# Patient Record
Sex: Female | Born: 1985 | Race: Black or African American | Hispanic: No | Marital: Married | Smoking: Never smoker
Health system: Southern US, Community
[De-identification: ages and names within clinical notes are randomized; demographics above are authoritative.]

## PROBLEM LIST (undated history)

## (undated) DIAGNOSIS — Z789 Other specified health status: Secondary | ICD-10-CM

## (undated) DIAGNOSIS — IMO0001 Reserved for inherently not codable concepts without codable children: Secondary | ICD-10-CM

## (undated) HISTORY — PX: NO PAST SURGERIES: SHX2092

---

## 2011-03-31 ENCOUNTER — Inpatient Hospital Stay (HOSPITAL_COMMUNITY): Admission: AD | Admit: 2011-03-31 | Payer: Self-pay | Source: Ambulatory Visit | Admitting: Obstetrics and Gynecology

## 2011-11-22 LAB — OB RESULTS CONSOLE RUBELLA ANTIBODY, IGM: Rubella: IMMUNE

## 2011-11-22 LAB — OB RESULTS CONSOLE HEPATITIS B SURFACE ANTIGEN: Hepatitis B Surface Ag: NEGATIVE

## 2011-11-22 LAB — OB RESULTS CONSOLE RPR: RPR: NONREACTIVE

## 2011-11-22 LAB — OB RESULTS CONSOLE ABO/RH: RH Type: POSITIVE

## 2011-11-30 LAB — OB RESULTS CONSOLE GC/CHLAMYDIA: Chlamydia: NEGATIVE

## 2012-03-06 NOTE — L&D Delivery Note (Signed)
Delivery Note  First Stage: Labor onset: 0800 Augmentation : none Analgesia /Anesthesia intrapartum: none SROM at 0930  Second Stage: Complete dilation at 1310 Onset of pushing at 1320 FHR second stage 90 -110 baseline range - continuous EFM placed with second stage  Delivery of a viable female at 1332 by CNM in LOA position Loose single wrap nuchal cord - reduced over head at delivery Cord double clamped after cessation of pulsation, cut by FOB  Baby to Dad for bonding  Mother assisted out of tub and to bed for third stage care  Cord blood sample collected   Third Stage: Placenta delivered [facility] intact with 3 VC @ 1343 Placenta disposition: for disposal Uterine tone firm / bleeding small  1st perineal laceration identified  - bilateral sulcus lacerations - right 1/2 cm and left 2cm lengths Anesthesia for repair: 1% xylocaine local Repair 3-0 vicryl for vaginal repair / 4-0 subcuticular perineal skin repair Est. Blood Loss (mL): 350  Complications: none  Mom to postpartum.  Baby to  Mom for bonding.  Newborn: Birth Weight: 6 pounds 15 ounces Apgar Scores: 9-9 Feeding planned: breast / circumcision planned  Marlinda Mike CNM, MSN 06/22/2012, 3:00 PM

## 2012-06-22 ENCOUNTER — Inpatient Hospital Stay (HOSPITAL_COMMUNITY)
Admission: AD | Admit: 2012-06-22 | Discharge: 2012-06-24 | DRG: 373 | Disposition: A | Discharge status: Home / Self Care | Payer: BC Managed Care – PPO | Source: Ambulatory Visit | Attending: Obstetrics and Gynecology | Admitting: Obstetrics and Gynecology

## 2012-06-22 ENCOUNTER — Encounter (HOSPITAL_COMMUNITY): Payer: Self-pay | Admitting: *Deleted

## 2012-06-22 DIAGNOSIS — O9903 Anemia complicating the puerperium: Secondary | ICD-10-CM | POA: Diagnosis not present

## 2012-06-22 DIAGNOSIS — D649 Anemia, unspecified: Secondary | ICD-10-CM | POA: Diagnosis not present

## 2012-06-22 HISTORY — DX: Other specified health status: Z78.9

## 2012-06-22 LAB — CBC
HCT: 34.6 % — ABNORMAL LOW (ref 36.0–46.0)
Hemoglobin: 11.7 g/dL — ABNORMAL LOW (ref 12.0–15.0)
MCH: 28 pg (ref 26.0–34.0)
MCHC: 33.8 g/dL (ref 30.0–36.0)
MCV: 82.8 fL (ref 78.0–100.0)
Platelets: 169 10*3/uL (ref 150–400)
RBC: 4.18 MIL/uL (ref 3.87–5.11)
RDW: 13.8 % (ref 11.5–15.5)
WBC: 13.3 10*3/uL — ABNORMAL HIGH (ref 4.0–10.5)

## 2012-06-22 LAB — RPR: RPR Ser Ql: NONREACTIVE

## 2012-06-22 MED ORDER — WITCH HAZEL-GLYCERIN EX PADS: 1.0000 "application " | MEDICATED_PAD | CUTANEOUS | Status: DC | PRN

## 2012-06-22 MED ORDER — IBUPROFEN 600 MG PO TABS: 600.0000 mg | ORAL_TABLET | Freq: Four times a day (QID) | ORAL | Status: DC | PRN

## 2012-06-22 MED ORDER — ACETAMINOPHEN 325 MG PO TABS: 650.0000 mg | ORAL_TABLET | ORAL | Status: DC | PRN

## 2012-06-22 MED ORDER — OXYCODONE-ACETAMINOPHEN 5-325 MG PO TABS
1.0000 | ORAL_TABLET | ORAL | Status: DC | PRN
Start: 2012-06-22 — End: 2012-06-24
  Filled 2012-06-22: qty 2

## 2012-06-22 MED ORDER — OXYTOCIN 10 UNIT/ML IJ SOLN
10.0000 [IU] | Freq: Once | INTRAMUSCULAR | Status: DC
  Filled 2012-06-22: qty 2

## 2012-06-22 MED ORDER — BENZOCAINE-MENTHOL 20-0.5 % EX AERO
1.0000 "application " | INHALATION_SPRAY | CUTANEOUS | Status: DC | PRN
  Administered 2012-06-22: 1 via TOPICAL
  Filled 2012-06-22: qty 56

## 2012-06-22 MED ORDER — SENNOSIDES-DOCUSATE SODIUM 8.6-50 MG PO TABS
2.0000 | ORAL_TABLET | Freq: Every day | ORAL | Status: DC
  Administered 2012-06-22 – 2012-06-23 (×2): 2 via ORAL

## 2012-06-22 MED ORDER — ONDANSETRON 4 MG PO TBDP
4.0000 mg | ORAL_TABLET | Freq: Once | ORAL | Status: AC
  Administered 2012-06-22: 4 mg via ORAL
  Filled 2012-06-22: qty 1

## 2012-06-22 MED ORDER — PRENATAL MULTIVITAMIN CH
1.0000 | ORAL_TABLET | Freq: Every day | ORAL | Status: DC
  Administered 2012-06-23: 1 via ORAL

## 2012-06-22 MED ORDER — CITRIC ACID-SODIUM CITRATE 334-500 MG/5ML PO SOLN: 30.0000 mL | ORAL | Status: DC | PRN

## 2012-06-22 MED ORDER — LANOLIN HYDROUS EX OINT: TOPICAL_OINTMENT | CUTANEOUS | Status: DC | PRN

## 2012-06-22 MED ORDER — DIPHENHYDRAMINE HCL 25 MG PO CAPS: 25.0000 mg | ORAL_CAPSULE | Freq: Four times a day (QID) | ORAL | Status: DC | PRN

## 2012-06-22 MED ORDER — SIMETHICONE 80 MG PO CHEW: 80.0000 mg | CHEWABLE_TABLET | ORAL | Status: DC | PRN

## 2012-06-22 MED ORDER — DIBUCAINE 1 % RE OINT: 1.0000 "application " | TOPICAL_OINTMENT | RECTAL | Status: DC | PRN

## 2012-06-22 MED ORDER — IBUPROFEN 600 MG PO TABS
600.0000 mg | ORAL_TABLET | Freq: Four times a day (QID) | ORAL | Status: DC
  Administered 2012-06-22 – 2012-06-24 (×8): 600 mg via ORAL
  Filled 2012-06-22 (×9): qty 1

## 2012-06-22 MED ORDER — LIDOCAINE HCL (PF) 1 % IJ SOLN
30.0000 mL | INTRAMUSCULAR | Status: DC | PRN
  Filled 2012-06-22 (×2): qty 30

## 2012-06-22 NOTE — Progress Notes (Signed)
S:  Ctx intense - some pressure with every ctx       Labored in tub 65 minutes - assisted out of tub - water temperature 96.7  O:  VS: Blood pressure 105/28, pulse 92, temperature 97.8 F (36.6 C), temperature source Oral, resp. rate 20, height 5\' 3"  (1.6 m), weight 83.462 kg (184 lb), unknown if currently breastfeeding.        FHR : 110 - moderate variability + accels with intermittent EFM tracing while out of tub        Toco: contractions every 3 minutes         Cervix : 8 / 90 / vtx / +2        Membranes: clear fluid / + show  A: active labor - transitioning     FHR reassuring - intermittent EFM  P: attempting to warm tub water for patient to re-enter tub       Inadequate water temperature in hospital - maintenance working to increase temperature        Continuous labor support with family - doula - midwife   Marlinda Mike CNM, MSN 06/22/2012, 1245pm

## 2012-06-22 NOTE — Progress Notes (Signed)
S:  calm - relaxing between ctx in tub       laboring in tub past 50 minutes - out to void x 1       + constant nausea - vomiting x 4  O:  VS: Blood pressure 119/72, pulse 89, temperature 97.6 F (36.4 C), temperature source Oral, resp. rate 18, height 5\' 3"  (1.6 m), weight 83.462 kg (184 lb).        FHR : doppler FHR        Toco: contractions every 3 minutes / moderate         Cervix : deferred at this time - recheck when out of tub next void        Membranes: clear fluid leakage        water temp 97 at 1145  A: active labor       P: water immersion with intermittent FHR     zofran ODT for nausea now     out of tub in next 1/2 hour - empty and & replace water ( hazy water - temp at 97)   Marlinda Mike CNM, MSN 06/22/2012, 12:03 PM

## 2012-06-22 NOTE — MAU Note (Signed)
Pt present with complaints of contractions that started being consistent around 8 am. States leakage of fluid that started about ago with clear fluid.

## 2012-06-22 NOTE — H&P (Signed)
  OB ADMISSION/ HISTORY & PHYSICAL:  Admission Date: 06/22/2012  9:43 AM  Admit Diagnosis: onset active labor  Tina Bass is a 27 y.o. female presenting for onset of labor - 0800 / SROM at 0930.  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  Prenatal Labs: ABO, Rh: A (09/18 0000) Positive Antibody: Negative (09/18 0000) Rubella: Immune (09/18 0000)  RPR: Nonreactive (09/18 0000)  HBsAg: Negative (09/18 0000)  HIV: Non-reactive (09/18 0000)  GBS: Negative (03/19 0000)  1 hr Glucola : NL  Medical / Surgical History :  Past medical history: History reviewed. No pertinent past medical history.   Past surgical history: History reviewed. No pertinent past surgical history.  Family History: History reviewed. No pertinent family history.   Social History:  reports that she has never smoked. She does not have any smokeless tobacco history on file. Her alcohol and drug histories are not on file.  Allergies: Review of patient's allergies indicates no known allergies.   Current Medications at time of admission:  Prenatal vitamin Valtrex 1000mg  suppression  Review of Systems: Ctx 0800 + bloody show - small amount SROM - clear fluid 1000  Physical Exam:  VS: Blood pressure 119/72, pulse 89, temperature 97.6 F (36.4 C), temperature source Oral, resp. rate 18.  General: alert and oriented, appears uncomfortable with ctx Heart: RRR Lungs: Clear lung fields Abdomen: Gravid, soft and non-tender, non-distended / uterus: nontender / gravid Extremities: trace edema  Genitalia / VE: Dilation: 4 Effacement (%): 90 Station: 0  FHR: baseline rate  / variability 115 / accels + / decels none TOCO: 2-3 minutes moderate  Assessment: [redacted] weeks gestation active stage of labor FHR category 1   Plan:  admit Dr Billy Coast -  (on-call) notified of admission / plan of care Dr Cherly Hensen - (primary) notifed of admit / pt request for her to come if changes her mind about water birth or needs CS   Marlinda Mike CNM, MSN 06/22/2012, 10:47 AM

## 2012-06-22 NOTE — Progress Notes (Signed)
Pt refused cbc at this time

## 2012-06-23 LAB — CBC
HCT: 27.7 % — ABNORMAL LOW (ref 36.0–46.0)
Hemoglobin: 9.5 g/dL — ABNORMAL LOW (ref 12.0–15.0)
MCH: 28.2 pg (ref 26.0–34.0)
MCHC: 34.3 g/dL (ref 30.0–36.0)
MCV: 82.2 fL (ref 78.0–100.0)
Platelets: 150 10*3/uL (ref 150–400)
RBC: 3.37 MIL/uL — ABNORMAL LOW (ref 3.87–5.11)
RDW: 13.8 % (ref 11.5–15.5)
WBC: 15.3 10*3/uL — ABNORMAL HIGH (ref 4.0–10.5)

## 2012-06-23 NOTE — Progress Notes (Signed)
PPD 1 SVD / Linden Dolin  S:  Reports feeling well             Tolerating po/ No nausea or vomiting             Bleeding is light             Pain controlled with motrin             Up ad lib / ambulatory / voiding QS  Newborn breast feeding  / Circumcision planned today ( Dr Wonda Olds)   O:               VS: BP 95/57  Pulse 80  Temp(Src) 98.3 F (36.8 C) (Oral)  Resp 18  Ht 5\' 3"  (1.6 m)  Wt 83.462 kg (184 lb)  BMI 32.6 kg/m2  SpO2 100%   LABS:  Recent Labs  06/22/12 1303 06/23/12 0630  WBC 13.3* 15.3*  HGB 11.7* 9.5*  PLT 169 150                Physical Exam:             Alert and oriented X3  Lungs: Clear and unlabored  Heart: regular rate and rhythm / no mumurs  Abdomen: soft, non-tender, non-distended              Fundus: firm, non-tender, U-1  Perineum: mild edema / ice in place  Lochia: light  Extremities: no edema, no calf pain or tenderness   A: PPD # 1              Mild anemia  Doing well - stable status  P:  Routine post partum orders  Possible early DC this pm  Marlinda Mike CNM, MSN 06/23/2012, 9:45 AM

## 2012-06-24 MED ORDER — IBUPROFEN 600 MG PO TABS: 600.0000 mg | ORAL_TABLET | Freq: Four times a day (QID) | ORAL | Status: DC

## 2012-06-24 MED ORDER — DOCUSATE SODIUM 100 MG PO CAPS: 100.0000 mg | ORAL_CAPSULE | Freq: Two times a day (BID) | ORAL | Status: AC | PRN

## 2012-06-24 MED ORDER — TETANUS-DIPHTH-ACELL PERTUSSIS 5-2.5-18.5 LF-MCG/0.5 IM SUSP
0.5000 mL | Freq: Once | INTRAMUSCULAR | Status: AC
  Administered 2012-06-24: 0.5 mL via INTRAMUSCULAR
  Filled 2012-06-24: qty 0.5

## 2012-06-24 NOTE — Progress Notes (Signed)
Post discharge review completed. 

## 2012-06-24 NOTE — Discharge Summary (Signed)
Obstetric Discharge Summary Reason for Admission: G1 P0 @ 39w 3d with onset of active labor and SROM Prenatal Procedures: ultrasound Intrapartum Procedures: spontaneous vaginal delivery Postpartum Procedures: none Complications-Operative and Postpartum: 1st degree perineal laceration Hemoglobin  Date Value Range Status  06/23/2012 9.5* 12.0 - 15.0 g/dL Final     REPEATED TO VERIFY     DELTA CHECK NOTED     HCT  Date Value Range Status  06/23/2012 27.7* 36.0 - 46.0 % Final    Physical Exam:  General: alert, cooperative and no distress Lochia: appropriate Uterine Fundus: firm Incision: n/a DVT Evaluation: No evidence of DVT seen on physical exam.  Discharge Diagnoses: G1 P1 s/p SVD with 1st deg lac/ Successful water birth  Discharge Information: Date: 06/24/2012 Activity: pelvic rest Diet: routine Medications: PNV, Ibuprofen and Colace Condition: stable Instructions: refer to practice specific booklet Discharge to: home Follow-up Information   Follow up with COUSINS,SHERONETTE A, MD In 6 weeks.   Contact information:   [address] [place] [address] [phone]       Newborn Data: Live born female on 06/22/12 Montez Morita) Birth Weight: 6 lb 15.3 oz (3155 g) APGAR: 9, 9  Home with mother.  Dona Klemann K 06/24/2012, 10:20 AM

## 2012-06-24 NOTE — Progress Notes (Signed)
Patient ID: Tina Bass, female   DOB: Apr 15, 1985, 27 y.o.   MRN: [id number] PPD # 2  Subjective: Pt reports feeling well and eager for d/c home/ Pain controlled with ibuprofen Tolerating po/ Voiding without problems/ No n/v Bleeding is light/ Newborn info:  Information for the patient's newborn:  Syerra, Abdelrahman [[id number]]  female  / circ completed per Dr Cherly Hensen yesterday/ Feeding: breast    Objective:  VS: Blood pressure 99/67, pulse 64, temperature 97.9 F (36.6 C), temperature source Oral, resp. rate 18.    Recent Labs  06/22/12 1303 06/23/12 0630  WBC 13.3* 15.3*  HGB 11.7* 9.5*  HCT 34.6* 27.7*  PLT 169 150    Blood type: A/Positive/-- (09/18 0000) Rubella: Immune (09/18 0000)    Physical Exam:  General: A & O x 3  alert, cooperative and no distress CV: Regular rate and rhythm Resp: clear Abdomen: soft, nontender, normal bowel sounds Uterine Fundus: firm, below umbilicus, nontender Perineum: healing with good reapproximation Lochia: minimal Ext: Homans sign is negative, no sign of DVT and no edema, redness or tenderness in the calves or thighs    A/P: PPD # 2/ G1P1001/ S/P: SVD with 1st deg lac Doing well and stable for discharge home RX: Ibuprofen 600mg  po Q 6 hrs prn pain #30 Refill x 1 Colace 100mg  po BID prn #60 Ref x 1 WOB/GYN booklet given Routine pp visit in 6wks   Demetrius Revel, MSN, [facility] 06/24/2012, 9:54 AM

## 2014-01-05 ENCOUNTER — Encounter (HOSPITAL_COMMUNITY): Payer: Self-pay | Admitting: *Deleted

## 2014-02-18 LAB — OB RESULTS CONSOLE RPR: RPR: NONREACTIVE

## 2014-02-18 LAB — OB RESULTS CONSOLE ANTIBODY SCREEN: Antibody Screen: NEGATIVE

## 2014-02-18 LAB — OB RESULTS CONSOLE ABO/RH: RH Type: POSITIVE

## 2014-02-18 LAB — OB RESULTS CONSOLE HIV ANTIBODY (ROUTINE TESTING): HIV: NONREACTIVE

## 2014-02-18 LAB — OB RESULTS CONSOLE HEPATITIS B SURFACE ANTIGEN: Hepatitis B Surface Ag: NEGATIVE

## 2014-02-18 LAB — OB RESULTS CONSOLE RUBELLA ANTIBODY, IGM: Rubella: IMMUNE

## 2014-03-02 LAB — OB RESULTS CONSOLE GC/CHLAMYDIA
Chlamydia: NEGATIVE
Gonorrhea: NEGATIVE

## 2014-03-06 NOTE — L&D Delivery Note (Signed)
Delivery Note  First Stage: Labor onset: 0100 Augmentation : none Analgesia /Anesthesia intrapartum: water immersion SROM at 0430 clear fluid  Second Stage: Complete dilation at 0438 Onset of pushing at 0438 FHR second stage 130 by doppler / rise in FHR heard with contraction  Delivery of a viable female at 46 by CNM in LOA position Tight nuchal cord x 1  Cord double clamped after cessation of pulsation, cut by FOB Cord blood sample collected    Third Stage: Placenta delivered via Tomasa Blase intact with 3 VC @ 0504 Trailing membranes removed manually using Fundal massage and firm grip with Ring Forceps / intact / no retained pieces noted with bimanual sweep of uterine cavity Placenta disposition: L&D for 24 hr storage, then patient taking it home for encapsulation Uterine tone firm / bleeding small after repair  2nd perineal laceration identified  Anesthesia for repair: 1% lidocaine Repair 2.0 vicryl Est. Blood Loss (mL): 375 (~200 in pool immediately after delivery)  Complications: none  Mom to postpartum.  Baby to Couplet care / Skin to Skin.  Newborn: Birth Weight: 6 lbs 10.7 oz  Apgar Scores: 8/9 Feeding planned: breast  Raelyn Mora, M  MSN, CNM 10/01/2014, 6:26 AM

## 2014-03-29 ENCOUNTER — Encounter (HOSPITAL_COMMUNITY): Payer: Self-pay | Admitting: *Deleted

## 2014-03-29 ENCOUNTER — Emergency Department (HOSPITAL_COMMUNITY)
Admission: EM | Admit: 2014-03-29 | Discharge: 2014-03-29 | Disposition: A | Discharge status: Home / Self Care | Payer: Federal, State, Local not specified - PPO | Attending: Emergency Medicine | Admitting: Emergency Medicine

## 2014-03-29 DIAGNOSIS — R112 Nausea with vomiting, unspecified: Secondary | ICD-10-CM | POA: Insufficient documentation

## 2014-03-29 LAB — I-STAT BETA HCG BLOOD, ED (MC, WL, AP ONLY)

## 2014-03-29 LAB — COMPREHENSIVE METABOLIC PANEL
ALBUMIN: 3.8 g/dL (ref 3.5–5.2)
ALK PHOS: 32 U/L — AB (ref 39–117)
ALT: 23 U/L (ref 0–35)
AST: 27 U/L (ref 0–37)
Anion gap: 12 (ref 5–15)
BUN: 14 mg/dL (ref 6–23)
CALCIUM: 9.2 mg/dL (ref 8.4–10.5)
CHLORIDE: 101 mmol/L (ref 96–112)
CO2: 22 mmol/L (ref 19–32)
CREATININE: 0.76 mg/dL (ref 0.50–1.10)
GFR calc non Af Amer: >90 mL/min (ref >90)
Glucose, Bld: 91 mg/dL (ref 70–99)
Potassium: 3.7 mmol/L (ref 3.5–5.1)
Sodium: 135 mmol/L (ref 135–145)
Total Bilirubin: 0.7 mg/dL (ref 0.3–1.2)
Total Protein: 7.7 g/dL (ref 6.0–8.3)

## 2014-03-29 LAB — CBC WITH DIFFERENTIAL/PLATELET
BASOS ABS: 0 10*3/uL (ref 0.0–0.1)
Basophils Relative: 0 % (ref 0–1)
Eosinophils Absolute: 0 10*3/uL (ref 0.0–0.7)
Eosinophils Relative: 0 % (ref 0–5)
HEMATOCRIT: 44.1 % (ref 36.0–46.0)
Hemoglobin: 14.8 g/dL (ref 12.0–15.0)
LYMPHS ABS: 0.5 10*3/uL — AB (ref 0.7–4.0)
Lymphocytes Relative: 7 % — ABNORMAL LOW (ref 12–46)
MCH: 27.3 pg (ref 26.0–34.0)
MCHC: 33.6 g/dL (ref 30.0–36.0)
MCV: 81.4 fL (ref 78.0–100.0)
MONO ABS: 0.2 10*3/uL (ref 0.1–1.0)
Monocytes Relative: 3 % (ref 3–12)
NEUTROS ABS: 5.9 10*3/uL (ref 1.7–7.7)
NEUTROS PCT: 90 % — AB (ref 43–77)
Platelets: 192 10*3/uL (ref 150–400)
RBC: 5.42 MIL/uL — ABNORMAL HIGH (ref 3.87–5.11)
RDW: 13.7 % (ref 11.5–15.5)
WBC: 6.6 10*3/uL (ref 4.0–10.5)

## 2014-03-29 MED ORDER — SODIUM CHLORIDE 0.9 % IV BOLUS (SEPSIS)
1000.0000 mL | Freq: Once | INTRAVENOUS | Status: AC
  Administered 2014-03-29: 1000 mL via INTRAVENOUS

## 2014-03-29 NOTE — ED Notes (Addendum)
Pt reports n/v this am but denies any diarrhea. Pt is approx [redacted] weeks pregnant.

## 2014-03-29 NOTE — ED Notes (Signed)
Attempted to draw pts labs was unsuccessful  

## 2014-03-29 NOTE — ED Provider Notes (Signed)
CSN: [id number]     Arrival date & time 03/29/14  1500 History   First MD Initiated Contact with Patient 03/29/14 1711     Chief Complaint  Patient presents with  . Emesis     (Consider location/radiation/quality/duration/timing/severity/associated sxs/prior Treatment) Patient is a 29 y.o. female presenting with vomiting.  Emesis Severity:  Moderate Duration:  1 day Timing:  Constant Quality:  Stomach contents Progression:  Unchanged Chronicity:  New Recent urination:  Normal Context: not post-tussive and not self-induced   Relieved by:  Nothing Worsened by:  Nothing tried Ineffective treatments:  None tried Associated symptoms: no abdominal pain, no chills, no cough, no diarrhea, no myalgias and no URI   Risk factors: pregnant now     Past Medical History  Diagnosis Date  . Medical history non-contributory    Past Surgical History  Procedure Laterality Date  . No past surgeries     History reviewed. No pertinent family history. History  Substance Use Topics  . Smoking status: Never Smoker   . Smokeless tobacco: Never Used  . Alcohol Use: No   OB History    Gravida Para Term Preterm AB TAB SAB Ectopic Multiple Living   Review of Systems  Constitutional: Negative for chills.  Gastrointestinal: Positive for vomiting. Negative for abdominal pain and diarrhea.  Musculoskeletal: Negative for myalgias.  All other systems reviewed and are negative.     Allergies  Review of patient's allergies indicates no known allergies.  Home Medications   Prior to Admission medications   Medication Sig Start Date End Date Taking? Authorizing Provider  Prenatal Vit-Fe Fumarate-FA (PRENATAL MULTIVITAMIN) TABS Take 1 tablet by mouth at bedtime.    Yes Historical Provider, MD  ibuprofen (ADVIL,MOTRIN) 600 MG tablet Take 1 tablet (600 mg total) by mouth every 6 (six) hours. Patient not taking: Reported on 03/29/2014 06/24/12   Demetrius Revel, NP   BP 107/54  mmHg  Pulse 88  Temp(Src) 98 F (36.7 C) (Oral)  Resp 18  SpO2 100% Physical Exam  Constitutional: She is oriented to person, place, and time. She appears well-developed and well-nourished.  HENT:  Head: Normocephalic and atraumatic.  Right Ear: External ear normal.  Left Ear: External ear normal.  Eyes: Conjunctivae and EOM are normal. Pupils are equal, round, and reactive to light.  Neck: Normal range of motion. Neck supple.  Cardiovascular: Normal rate, regular rhythm, normal heart sounds and intact distal pulses.   Pulmonary/Chest: Effort normal and breath sounds normal.  Abdominal: Soft. Bowel sounds are normal. There is no tenderness.  Musculoskeletal: Normal range of motion.  Neurological: She is alert and oriented to person, place, and time.  Skin: Skin is warm and dry.  Vitals reviewed.   ED Course  Procedures (including critical care time) Labs Review Labs Reviewed  CBC WITH DIFFERENTIAL/PLATELET - Abnormal; Notable for the following:    RBC 5.42 (*)    Neutrophils Relative % 90 (*)    Lymphocytes Relative 7 (*)    Lymphs Abs 0.5 (*)    All other components within normal limits  COMPREHENSIVE METABOLIC PANEL - Abnormal; Notable for the following:    Alkaline Phosphatase 32 (*)    All other components within normal limits  I-STAT BETA HCG BLOOD, ED ([facility], [facility], [facility] ONLY) - Abnormal; Notable for the following:    I-stat hCG, quantitative >2000.0 (*)    All other components within normal limits  Imaging Review No results found.   EKG Interpretation None      EMERGENCY DEPARTMENT US PREGNANCY "Study: Limited Ultrasound of the Pelvis for Pregnancy"  INDICATIONS:Pregnancy(required) Multiple views of the uterus and pelvic cavity were obtained in real-time with a multi-frequency probe.  APPROACH:Transabdominal   PERFORMED BY: Myself  IMAGES ARCHIVED?: Yes  LIMITATIONS: Emergent procedure  PREGNANCY FREE FLUID: None  ADNEXAL FINDINGS:None  PREGNANCY  FINDINGS: Fetal heart activity seen  INTERPRETATION: Viable intrauterine pregnancy  GESTATIONAL AGE, ESTIMATE: 13 wks 5 days  FETAL HEART RATE: present, limited by movement of fetus  Emergency Ultrasound Study:   Angiocath insertion Performed by: Mirian MoGentry, Matthew  Consent: Verbal consent obtained. Risks and benefits: risks, benefits and alternatives were discussed Immediately prior to procedure the correct patient, procedure, equipment, support staff and site/side marked as needed.  Indication: difficult IV access Preparation: Patient was prepped and draped in the usual sterile fashion. Vein Location: R brachial vein was visualized during assessment for potential access sites and was found to be patent/ easily compressed with linear ultrasound.  The needle was visualized with real-time ultrasound and guided into the vein. Gauge: 20  Image saved and stored.  Normal blood return.  Patient tolerance: Patient tolerated the procedure well with no immediate complications.         MDM   Final diagnoses:  Non-intractable vomiting with nausea, vomiting of unspecified type    29 y.o. female with pertinent PMH of G2P1, no prior problems with pregnancy presents with nausea and vomiting x 1 day.  Patient's son has had similar symptoms. On arrival patient has vital signs and physical exam as above. Ultrasound obtained of abdomen with normal IUP, also used for vascular access. Discussed risks of Zofran with patient.  She elected to forego this treatment.   Symptoms improved after NS bolus.  Pt requested to return home. Strict Return precautions given.  I have reviewed all laboratory and imaging studies if ordered as above  1. Non-intractable vomiting with nausea, vomiting of unspecified type         Mirian MoMatthew Gentry, MD 03/30/14 0045

## 2014-03-29 NOTE — Discharge Instructions (Signed)
Nausea and Vomiting Nausea is a sick feeling that often comes before throwing up (vomiting). Vomiting is a reflex where stomach contents come out of your mouth. Vomiting can cause severe loss of body fluids (dehydration). Children and elderly adults can become dehydrated quickly, especially if they also have diarrhea. Nausea and vomiting are symptoms of a condition or disease. It is important to find the cause of your symptoms. CAUSES   Direct irritation of the stomach lining. This irritation can result from increased acid production (gastroesophageal reflux disease), infection, food poisoning, taking certain medicines (such as nonsteroidal anti-inflammatory drugs), alcohol use, or tobacco use.  Signals from the brain.These signals could be caused by a headache, heat exposure, an inner ear disturbance, increased pressure in the brain from injury, infection, a tumor, or a concussion, pain, emotional stimulus, or metabolic problems.  An obstruction in the gastrointestinal tract (bowel obstruction).  Illnesses such as diabetes, hepatitis, gallbladder problems, appendicitis, kidney problems, cancer, sepsis, atypical symptoms of a heart attack, or eating disorders.  Medical treatments such as chemotherapy and radiation.  Receiving medicine that makes you sleep (general anesthetic) during surgery. DIAGNOSIS Your caregiver may ask for tests to be done if the problems do not improve after a few days. Tests may also be done if symptoms are severe or if the reason for the nausea and vomiting is not clear. Tests may include:  Urine tests.  Blood tests.  Stool tests.  Cultures (to look for evidence of infection).  X-rays or other imaging studies. Test results can help your caregiver make decisions about treatment or the need for additional tests. TREATMENT You need to stay well hydrated. Drink frequently but in small amounts.You may wish to drink water, sports drinks, clear broth, or eat frozen  ice pops or gelatin dessert to help stay hydrated.When you eat, eating slowly may help prevent nausea.There are also some antinausea medicines that may help prevent nausea. HOME CARE INSTRUCTIONS   Take all medicine as directed by your caregiver.  If you do not have an appetite, do not force yourself to eat. However, you must continue to drink fluids.  If you have an appetite, eat a normal diet unless your caregiver tells you differently.  Eat a variety of complex carbohydrates (rice, wheat, potatoes, bread), lean meats, yogurt, fruits, and vegetables.  Avoid high-fat foods because they are more difficult to digest.  Drink enough water and fluids to keep your urine clear or pale yellow.  If you are dehydrated, ask your caregiver for specific rehydration instructions. Signs of dehydration may include:  Severe thirst.  Dry lips and mouth.  Dizziness.  Dark urine.  Decreasing urine frequency and amount.  Confusion.  Rapid breathing or pulse. SEEK IMMEDIATE MEDICAL CARE IF:   You have blood or brown flecks (like coffee grounds) in your vomit.  You have black or bloody stools.  You have a severe headache or stiff neck.  You are confused.  You have severe abdominal pain.  You have chest pain or trouble breathing.  You do not urinate at least once every 8 hours.  You develop cold or clammy skin.  You continue to vomit for longer than 24 to 48 hours.  You have a fever. MAKE SURE YOU:   Understand these instructions.  Will watch your condition.  Will get help right away if you are not doing well or get worse. Document Released: 02/20/2005 Document Revised: 05/15/2011 Document Reviewed: 07/20/2010 Exit[employer] Patient Information 2015 [employer]. This information is not intended   to replace advice given to you by your health care provider. Make sure you discuss any questions you have with your health care provider. Hyperemesis Gravidarum Hyperemesis gravidarum is  a severe form of nausea and vomiting that happens during pregnancy. Hyperemesis is worse than morning sickness. It may cause you to have nausea or vomiting all day for many days. It may keep you from eating and drinking enough food and liquids. Hyperemesis usually occurs during the first half (the first 20 weeks) of pregnancy. It often goes away once a woman is in her second half of pregnancy. However, sometimes hyperemesis continues through an entire pregnancy.  CAUSES  The cause of this condition is not completely known but is thought to be related to changes in the body's hormones when pregnant. It could be from the high level of the pregnancy hormone or an increase in estrogen in the body.  SIGNS AND SYMPTOMS   Severe nausea and vomiting.  Nausea that does not go away.  Vomiting that does not allow you to keep any food down.  Weight loss and body fluid loss (dehydration).  Having no desire to eat or not liking food you have previously enjoyed. DIAGNOSIS  Your health care provider will do a physical exam and ask you about your symptoms. He or she may also order blood tests and urine tests to make sure something else is not causing the problem.  TREATMENT  You may only need medicine to control the problem. If medicines do not control the nausea and vomiting, you will be treated in the hospital to prevent dehydration, increased acid in the blood (acidosis), weight loss, and changes in the electrolytes in your body that may harm the unborn baby (fetus). You may need IV fluids.  HOME CARE INSTRUCTIONS   Only take over-the-counter or prescription medicines as directed by your health care provider.  Try eating a couple of dry crackers or toast in the morning before getting out of bed.  Avoid foods and smells that upset your stomach.  Avoid fatty and spicy foods.  Eat 5-6 small meals a day.  Do not drink when eating meals. Drink between meals.  For snacks, eat high-protein foods, such as  cheese.  Eat or suck on things that have ginger in them. Ginger helps nausea.  Avoid food preparation. The smell of food can spoil your appetite.  Avoid iron pills and iron in your multivitamins until after 3-4 months of being pregnant. However, consult with your health care provider before stopping any prescribed iron pills. SEEK MEDICAL CARE IF:   Your abdominal pain increases.  You have a severe headache.  You have vision problems.  You are losing weight. SEEK IMMEDIATE MEDICAL CARE IF:   You are unable to keep fluids down.  You vomit blood.  You have constant nausea and vomiting.  You have excessive weakness.  You have extreme thirst.  You have dizziness or fainting.  You have a fever or persistent symptoms for more than 2-3 days.  You have a fever and your symptoms suddenly get worse. MAKE SURE YOU:   Understand these instructions.  Will watch your condition.  Will get help right away if you are not doing well or get worse. Document Released: 02/20/2005 Document Revised: 12/11/2012 Document Reviewed: 10/02/2012 [facility] LP[facility] Patient Information 2015 [employer], [place]. This information is not intended to replace advice given to you by your health care provider. Make sure you discuss any questions you have with your health care provider.

## 2014-03-29 NOTE — ED Notes (Signed)
Md at bedside

## 2014-09-02 LAB — OB RESULTS CONSOLE GBS: GBS: NEGATIVE

## 2014-10-01 ENCOUNTER — Inpatient Hospital Stay (HOSPITAL_COMMUNITY)
Admission: AD | Admit: 2014-10-01 | Discharge: 2014-10-02 | DRG: 775 | Disposition: A | Discharge status: Home / Self Care | Payer: Federal, State, Local not specified - PPO | Source: Ambulatory Visit | Attending: Obstetrics and Gynecology | Admitting: Obstetrics and Gynecology

## 2014-10-01 ENCOUNTER — Encounter (HOSPITAL_COMMUNITY): Payer: Self-pay | Admitting: *Deleted

## 2014-10-01 DIAGNOSIS — Z3A39 39 weeks gestation of pregnancy: Secondary | ICD-10-CM | POA: Diagnosis present

## 2014-10-01 DIAGNOSIS — IMO0001 Reserved for inherently not codable concepts without codable children: Secondary | ICD-10-CM

## 2014-10-01 HISTORY — DX: Reserved for inherently not codable concepts without codable children: IMO0001

## 2014-10-01 LAB — OB RESULTS CONSOLE GBS: STREP GROUP B AG: NEGATIVE

## 2014-10-01 MED ORDER — SENNOSIDES-DOCUSATE SODIUM 8.6-50 MG PO TABS
2.0000 | ORAL_TABLET | ORAL | Status: DC
  Administered 2014-10-02: 2 via ORAL
  Filled 2014-10-01: qty 2

## 2014-10-01 MED ORDER — ZOLPIDEM TARTRATE 5 MG PO TABS: 5.0000 mg | ORAL_TABLET | Freq: Every evening | ORAL | Status: DC | PRN

## 2014-10-01 MED ORDER — PRENATAL MULTIVITAMIN CH
1.0000 | ORAL_TABLET | Freq: Every day | ORAL | Status: DC
  Administered 2014-10-01 – 2014-10-02 (×2): 1 via ORAL
  Filled 2014-10-01 (×2): qty 1

## 2014-10-01 MED ORDER — OXYCODONE-ACETAMINOPHEN 5-325 MG PO TABS: 2.0000 | ORAL_TABLET | ORAL | Status: DC | PRN

## 2014-10-01 MED ORDER — IBUPROFEN 600 MG PO TABS
600.0000 mg | ORAL_TABLET | Freq: Four times a day (QID) | ORAL | Status: DC
  Administered 2014-10-01 – 2014-10-02 (×5): 600 mg via ORAL
  Filled 2014-10-01 (×5): qty 1

## 2014-10-01 MED ORDER — ACETAMINOPHEN 325 MG PO TABS: 650.0000 mg | ORAL_TABLET | ORAL | Status: DC | PRN

## 2014-10-01 MED ORDER — WITCH HAZEL-GLYCERIN EX PADS: 1.0000 "application " | MEDICATED_PAD | CUTANEOUS | Status: DC | PRN

## 2014-10-01 MED ORDER — CITRIC ACID-SODIUM CITRATE 334-500 MG/5ML PO SOLN: 30.0000 mL | ORAL | Status: DC | PRN

## 2014-10-01 MED ORDER — OXYTOCIN 10 UNIT/ML IJ SOLN
10.0000 [IU] | Freq: Once | INTRAMUSCULAR | Status: DC | PRN
  Filled 2014-10-01: qty 1

## 2014-10-01 MED ORDER — ONDANSETRON HCL 4 MG/2ML IJ SOLN: 4.0000 mg | INTRAMUSCULAR | Status: DC | PRN

## 2014-10-01 MED ORDER — LACTATED RINGERS IV SOLN: 500.0000 mL | INTRAVENOUS | Status: DC | PRN

## 2014-10-01 MED ORDER — TETANUS-DIPHTH-ACELL PERTUSSIS 5-2.5-18.5 LF-MCG/0.5 IM SUSP: 0.5000 mL | Freq: Once | INTRAMUSCULAR | Status: DC

## 2014-10-01 MED ORDER — OXYCODONE-ACETAMINOPHEN 5-325 MG PO TABS
1.0000 | ORAL_TABLET | ORAL | Status: DC | PRN
  Administered 2014-10-01: 1 via ORAL
  Filled 2014-10-01: qty 1

## 2014-10-01 MED ORDER — DIPHENHYDRAMINE HCL 25 MG PO CAPS: 25.0000 mg | ORAL_CAPSULE | Freq: Four times a day (QID) | ORAL | Status: DC | PRN

## 2014-10-01 MED ORDER — DIBUCAINE 1 % RE OINT: 1.0000 "application " | TOPICAL_OINTMENT | RECTAL | Status: DC | PRN

## 2014-10-01 MED ORDER — SIMETHICONE 80 MG PO CHEW: 80.0000 mg | CHEWABLE_TABLET | ORAL | Status: DC | PRN

## 2014-10-01 MED ORDER — LIDOCAINE HCL (PF) 1 % IJ SOLN
30.0000 mL | INTRAMUSCULAR | Status: DC | PRN
  Administered 2014-10-01: 30 mL via SUBCUTANEOUS
  Filled 2014-10-01: qty 30

## 2014-10-01 MED ORDER — ONDANSETRON HCL 4 MG PO TABS: 4.0000 mg | ORAL_TABLET | ORAL | Status: DC | PRN

## 2014-10-01 MED ORDER — BENZOCAINE-MENTHOL 20-0.5 % EX AERO: 1.0000 "application " | INHALATION_SPRAY | CUTANEOUS | Status: DC | PRN

## 2014-10-01 MED ORDER — OXYCODONE-ACETAMINOPHEN 5-325 MG PO TABS: 1.0000 | ORAL_TABLET | ORAL | Status: DC | PRN

## 2014-10-01 NOTE — H&P (Signed)
OB ADMISSION/ HISTORY & PHYSICAL:  Admission Date: 10/01/2014  2:45 AM  Admit Diagnosis: Active Labor at Term  Tina Bass is a 29 y.o. female presenting for regular contractions since 0100. Planning a waterbirth  Prenatal History: G2P1001   EDC : 10/02/2014, by LMP  Prenatal care at [facility] since 7.[redacted] weeks gestation Primary Care Provider at [facility]: Dr. Cherly Hensen  Prenatal course complicated by none  Prenatal Labs: ABO, Rh: A (12/16 0000)  Antibody: Negative (12/16 0000) Rubella: Immune (12/16 0000)  RPR: Nonreactive (12/16 0000)  HBsAg: Negative (12/16 0000)  HIV: Non-reactive (12/16 0000)  GBS: Negative (07/28 0324)  1 hr Glucola : 67   Medical / Surgical History :  Past medical history:  Past Medical History  Diagnosis Date  . Medical history non-contributory   . Active labor at term 10/01/2014     Past surgical history:  Past Surgical History  Procedure Laterality Date  . No past surgeries       Family History: History reviewed. No pertinent family history.   Social History:  reports that she has never smoked. She has never used smokeless tobacco. She reports that she does not drink alcohol or use illicit drugs.   Allergies: Review of patient's allergies indicates no known allergies.    Current Medications at time of admission:  Prescriptions prior to admission  Medication Sig Dispense Refill Last Dose  . ibuprofen (ADVIL,MOTRIN) 600 MG tablet Take 1 tablet (600 mg total) by mouth every 6 (six) hours. (Patient not taking: Reported on 03/29/2014) 30 tablet 1 Not Taking at Unknown time  . Prenatal Vit-Fe Fumarate-FA (PRENATAL MULTIVITAMIN) TABS Take 1 tablet by mouth at bedtime.    03/28/2014 at Unknown time      Review of Systems: Review of Systems  Constitutional: Negative.   HENT: Negative.   Eyes: Negative.   Respiratory: Negative.   Cardiovascular: Negative.   Gastrointestinal: Negative.   Genitourinary: Negative.         UC's every 1-2 mins; (+) FM  Musculoskeletal: Negative.   Skin: Negative.   Neurological: Negative.   Endo/Heme/Allergies: Negative.   Psychiatric/Behavioral: Negative.     Physical Exam:  Dilation: 6 Effacement (%): 90 Station: 0 Exam by:: Rdawson, CNM  Vitals BP 119/74, P 86, R 20, T 98.2   General: A&O x 3, NAD Heart: RRR, no murmurs Lungs: CTAB, unlabored Abdomen: Gravid, soft, non tender Extremities: atraumatic, normal ROM, no edema Genitalia / VE: normal / see VE above FHR: 125 bpm / moderate variability / accels present / no decels TOCO: regular, every 1.5-2 mins  Labs:    No results for input(s): WBC, HGB, HCT, PLT in the last 72 hours.   Assessment:  29 y.o. G2P1001 at [redacted]w[redacted]d  1. Labor: Active  2. Fetal Wellbeing: Category 1  3. Pain Control: water immersion 4. GBS: Negative 5. In Tub @ 0401 / water temp 99.8  Plan:  1. Admit to BS 2. Routine L&D orders 3. May labor/birth in tub    Drs. Cherly Hensen and Taavon notified of admission / plan of care    Kenard Gower, MSN, CNM 10/01/2014, 4:12 AM

## 2014-10-01 NOTE — Lactation Note (Signed)
This note was copied from the chart of Tina Bass. Lactation Consultation Note  Initial visit made.  Breastfeeding consultation services and support information given to patient and reviewed.  This is her second baby and she breast fed her first baby.  She stated they had initial difficulties which resolved.  Mom is pleased newborn has been nursing well since birth.  Instructed to feed with any feeding cue and skin to skin when possible.  Encouraged to call with concerns/assist prn.  Patient Name: Tina Bass [id number] Date: 10/01/2014 Reason for consult: Initial assessment   Maternal Data    Feeding    LATCH Score/Interventions                      Lactation Tools Discussed/Used     Consult Status Consult Status: Follow-up Date: 10/02/14 Follow-up type: In-patient    Huston Foley 10/01/2014, 1:17 PM

## 2014-10-02 LAB — CBC
HEMATOCRIT: 32.2 % — AB (ref 36.0–46.0)
Hemoglobin: 10.6 g/dL — ABNORMAL LOW (ref 12.0–15.0)
MCH: 27.4 pg (ref 26.0–34.0)
MCHC: 32.9 g/dL (ref 30.0–36.0)
MCV: 83.2 fL (ref 78.0–100.0)
PLATELETS: 174 10*3/uL (ref 150–400)
RBC: 3.87 MIL/uL (ref 3.87–5.11)
RDW: 14.2 % (ref 11.5–15.5)
WBC: 11 10*3/uL — AB (ref 4.0–10.5)

## 2014-10-02 LAB — RPR: RPR: NONREACTIVE

## 2014-10-02 MED ORDER — TRAMADOL HCL 50 MG PO TABS: 50.0000 mg | ORAL_TABLET | Freq: Four times a day (QID) | ORAL | Status: DC | PRN

## 2014-10-02 NOTE — Lactation Note (Signed)
This note was copied from the chart of Girl Kenzley Ke. Lactation Consultation Note  Patient Name: Girl Kasmira Cacioppo ZOXWR'U Date: 10/02/2014   Baby 36 hours old. Mom reports that breast feeding going very well, and mom has no needs at this time. Enc mom to continue nursing with cues.  Maternal Data    Feeding    [facility] Score/Interventions                      Lactation Tools Discussed/Used     Consult Status      Geralynn Ochs 10/02/2014, 4:47 PM

## 2014-10-02 NOTE — Discharge Summary (Signed)
Obstetric Discharge Summary Reason for Admission: onset of labor Prenatal Procedures: ultrasound Intrapartum Procedures: spontaneous vaginal waterbirth delivery Postpartum Procedures: none Complications-Operative and Postpartum: 2nd degree perineal laceration HEMOGLOBIN  Date Value Ref Range Status  10/02/2014 10.6* 12.0 - 15.0 g/dL Final   HCT  Date Value Ref Range Status  10/02/2014 32.2* 36.0 - 46.0 % Final    Physical Exam:  General: alert, cooperative and no distress Lochia: appropriate Uterine Fundus: firm, midline, U-2 Perineum: healing well, no significant drainage, no dehiscence, no significant erythema DVT Evaluation: No evidence of DVT seen on physical exam. Negative Homan's sign. No cords or calf tenderness. No significant calf/ankle edema.  Discharge Diagnoses: Term Pregnancy-delivered  Discharge Information: Date: 10/02/2014 Activity: pelvic rest Diet: routine Medications: PNV, Ibuprofen and Ultram Condition: stable Instructions: refer to practice specific booklet Discharge to: home Follow-up Information    Follow up with COUSINS,SHERONETTE A, MD. Schedule an appointment as soon as possible for a visit in 6 weeks.   Specialty:  Obstetrics and Gynecology   Why:  postpartum visit   Contact information:   [address] [place] [address] [phone]       Newborn Data: Live born female on 10/01/2014 Birth Weight: 6 lb 10.7 oz (3025 g) APGAR: 8, 9  Home with mother.  Raelyn Mora, M MSN, CNM 10/02/2014, 3:46 PM

## 2014-10-02 NOTE — Progress Notes (Signed)
Patient ID: [name], female   DOB: Aug 10, 1985, 29 y.o.   MRN: [id number] PPD # 1 SVD  S:  Reports feeling well, ready for early discharge             Tolerating po/ No nausea or vomiting             Bleeding is light             Pain controlled with ibuprofen (OTC) and narcotic analgesics including Percocet             Up ad lib / ambulatory / voiding without difficulties    Newborn  Information for the patient's newborn:  [name], [name] [[id number]]  female  breast feeding   O:  A & O x 3, in no apparent distress              VS:  Filed Vitals:   10/01/14 0800 10/01/14 2123 10/02/14 0515 10/02/14 1134  BP: 108/68 107/54 118/74 110/77  Pulse: 68 84 70 87  Temp: 98.1 F (36.7 C) 99 F (37.2 C) 98.4 F (36.9 C) 98.5 F (36.9 C)  TempSrc: Oral Oral Oral Oral  Resp: Height:      Weight:      SpO2: 100% 98% 100% 99%    LABS:  Recent Labs  10/02/14 0531  WBC 11.0*  HGB 10.6*  HCT 32.2*  PLT 174    Blood type: A/Positive/-- (12/16 0000)  Rubella: Immune (12/16 0000)   I&O: I/O last 3 completed shifts: In: -  Out: 375 [Blood:375]             Lungs: Clear and unlabored  Heart: regular rate and rhythm / no murmurs  Abdomen: soft, non-tender, non-distended              Fundus: firm, non-tender, U-2  Perineum: 2nd degree repair healing well  Lochia: minimal  Extremities: No edema, no calf pain or tenderness, No Homans    A/P: PPD # 1  29 y.o., [id number]   Principal Problem:    Postpartum care following vaginal (waterbirth) delivery (7/28)  Active Problems:    Active labor at term   Doing well - stable status  Routine post partum orders  Early discharge today    Calvyn Kurtzman, M, MSN, CNM 10/02/2014, 3:41 PM

## 2014-10-02 NOTE — Discharge Instructions (Signed)

## 2014-10-02 NOTE — Progress Notes (Signed)
Pt is discharged in the care of husband with infant in arms. Denies any pain or discomfort. Discharge instructions  With Rx were given to pt. Questions were asked and answered.  No equipment needed for home use.  Instructions were given on infant care . States she understands well.Denies excessive vaginal bleeding.Stable.

## 2017-04-25 ENCOUNTER — Ambulatory Visit (INDEPENDENT_AMBULATORY_CARE_PROVIDER_SITE_OTHER): Admitting: Physician Assistant

## 2017-04-25 ENCOUNTER — Ambulatory Visit (INDEPENDENT_AMBULATORY_CARE_PROVIDER_SITE_OTHER)

## 2017-04-25 ENCOUNTER — Other Ambulatory Visit: Payer: Self-pay

## 2017-04-25 ENCOUNTER — Encounter: Payer: Self-pay | Admitting: Physician Assistant

## 2017-04-25 VITALS — HR 72 | Resp 16 | Ht 63.0 in | Wt 155.4 lb

## 2017-04-25 DIAGNOSIS — M25571 Pain in right ankle and joints of right foot: Secondary | ICD-10-CM

## 2017-04-25 NOTE — Patient Instructions (Addendum)
Rest, ice, compression, elevation.  You could take 200-400 mg of ibuprofen every 8 hours as needed for pain.  The postop shoe will help quite a bit.  I would recommend wearing this for at least a week.   IF you received an x-ray today, you will receive an invoice from [facility]. Please contact [facility] at [phone] with questions or concerns regarding your invoice.   IF you received labwork today, you will receive an invoice from [facility]. Please contact [facility] at [phone] with questions or concerns regarding your invoice.   Our billing staff will not be able to assist you with questions regarding bills from these companies.  You will be contacted with the lab results as soon as they are available. The fastest way to get your results is to activate your My Chart account. Instructions are located on the last page of this paperwork. If you have not heard from us regarding the results in 2 weeks, please contact this office.

## 2017-04-25 NOTE — Progress Notes (Signed)
    04/25/2017 3:46 PM   DOB: 06/22/1985 / MRN: [id number]  SUBJECTIVE:  Tina Bass is a 32 y.o. female presenting for right foot pain about the lateral base of the fifth metatarsal.  This started 2 days ago.  States that she was doing some exercise and felt a twinge in the area in the next morning had pain but was able to walk.  Tells me today that she has been unable to walk secondary to the pain.  Denies a history of female athlete triad.  She has No Known Allergies.   She  has a past medical history of Active labor at term (10/01/2014), Medical history non-contributory, and Postpartum care following vaginal (waterbirth) delivery (7/28) (10/01/2014).    She  reports that  has never smoked. she has never used smokeless tobacco. She reports that she does not drink alcohol or use drugs. She  reports that she currently engages in sexual activity. She reports using the following method of birth control/protection: None. The patient  has a past surgical history that includes No past surgeries.  Her family history is not on file.  Review of Systems  Constitutional: Negative for chills, diaphoresis and fever.  Respiratory: Negative for cough, hemoptysis, sputum production, shortness of breath and wheezing.   Cardiovascular: Negative for chest pain, orthopnea and leg swelling.  Gastrointestinal: Negative for nausea.  Skin: Negative for rash.  Neurological: Negative for dizziness.    The problem list and medications were reviewed and updated by myself where necessary and exist elsewhere in the encounter.   OBJECTIVE:  Pulse 72   Resp 16   Ht 5\' 3"  (1.6 m)   Wt 155 lb 6.4 oz (70.5 kg)   SpO2 100%   BMI 27.53 kg/m   Physical Exam  Constitutional: She is active.  Non-toxic appearance.  Cardiovascular: Normal rate.  Pulmonary/Chest: Effort normal. No tachypnea.  Musculoskeletal:       Feet:  Neurological: She is alert.  Skin: Skin is warm and dry. She is not diaphoretic. No pallor.      No results found for this or any previous visit (from the past 72 hour(s)).  Dg Foot Complete Right  Result Date: 04/25/2017 CLINICAL DATA:  Pain for 2 days EXAM: RIGHT FOOT COMPLETE - 3+ VIEW COMPARISON:  None. FINDINGS: Frontal, oblique, and lateral views were obtained. No fracture or dislocation. Joint spaces appear normal. There is hallux valgus deformity at the first MTP joint with early bunion formation medially. No erosive change. IMPRESSION: Hallux valgus deformity first MTP joint with early bunion formation medially. No appreciable joint space narrowing or erosion. No fracture or dislocation. Electronically Signed   By: Bretta BangWilliam  Woodruff III M.D.   On: 04/25/2017 15:36    ASSESSMENT AND PLAN: Tina SackKendra was seen today for foot injury.  Diagnoses and all orders for this visit:  Pain in joint involving right ankle and foot: Ankle negative for fracture.  Advised ice and ibuprofen.  We will see her back as needed.  Postop shoe applied today for comfort. -     DG Foot Complete Right; Future    The patient is advised to call or return to clinic if she does not see an improvement in symptoms, or to seek the care of the closest emergency department if she worsens with the above plan.   Tina BostonMichael Gavan Nordby, MHS, PA-C Primary Care at [facility] 04/25/2017 3:46 PM

## 2018-05-05 IMAGING — DX DG FOOT COMPLETE 3+V*R*
3 series · 3 of 3 positions shown · non-contrast
Comparison: None.

CLINICAL DATA: Pain for 2 days

EXAM:
RIGHT FOOT COMPLETE - 3+ VIEW

[foot ap]
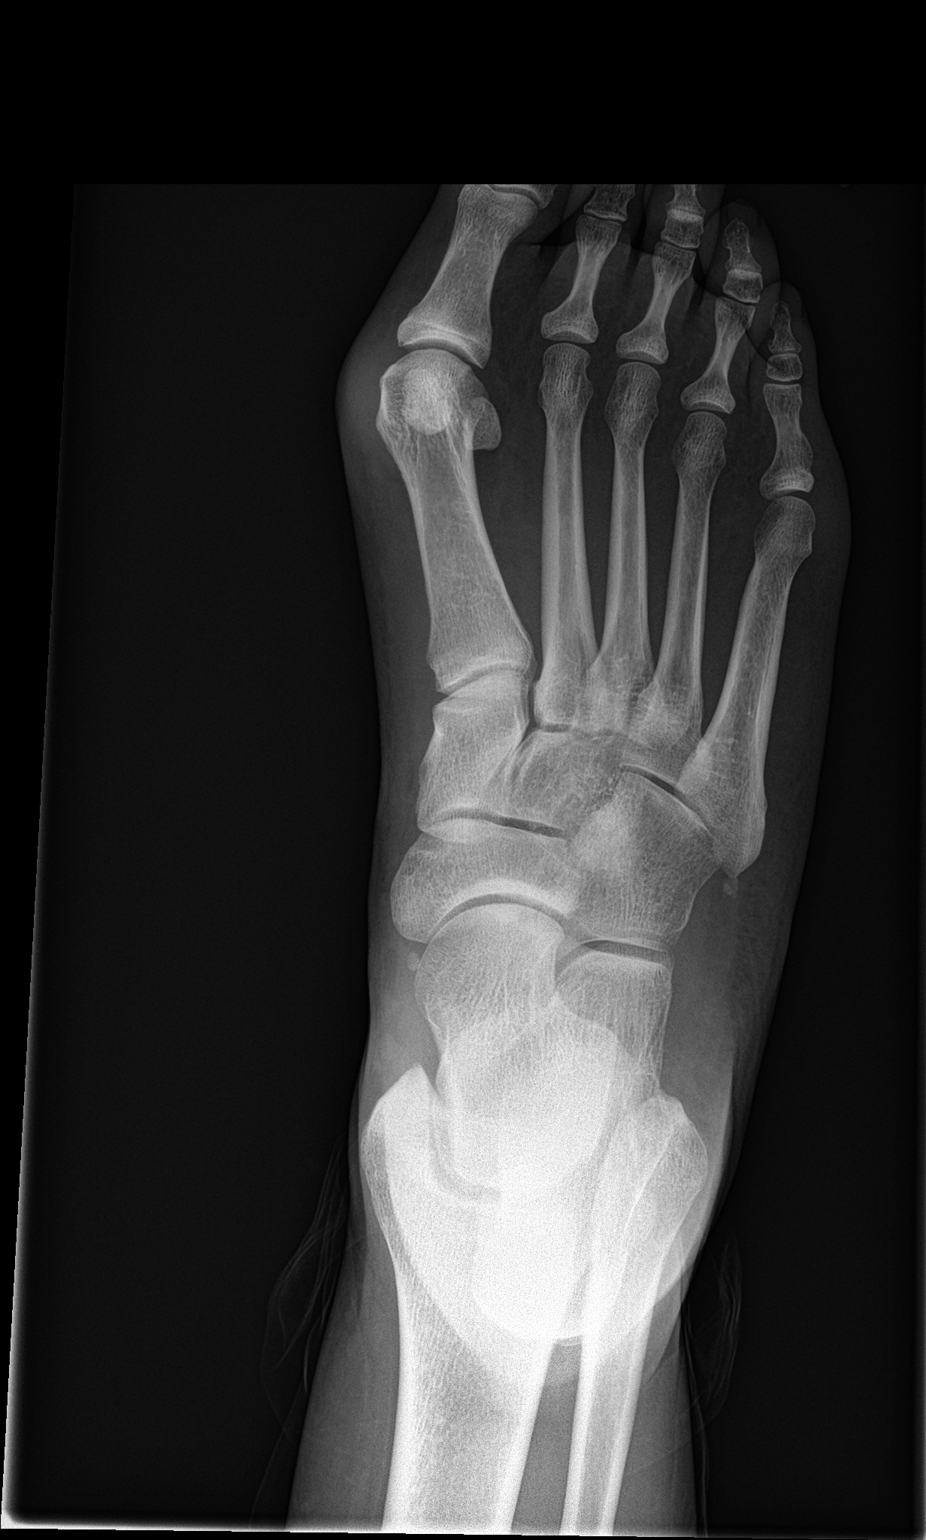

[foot obl]
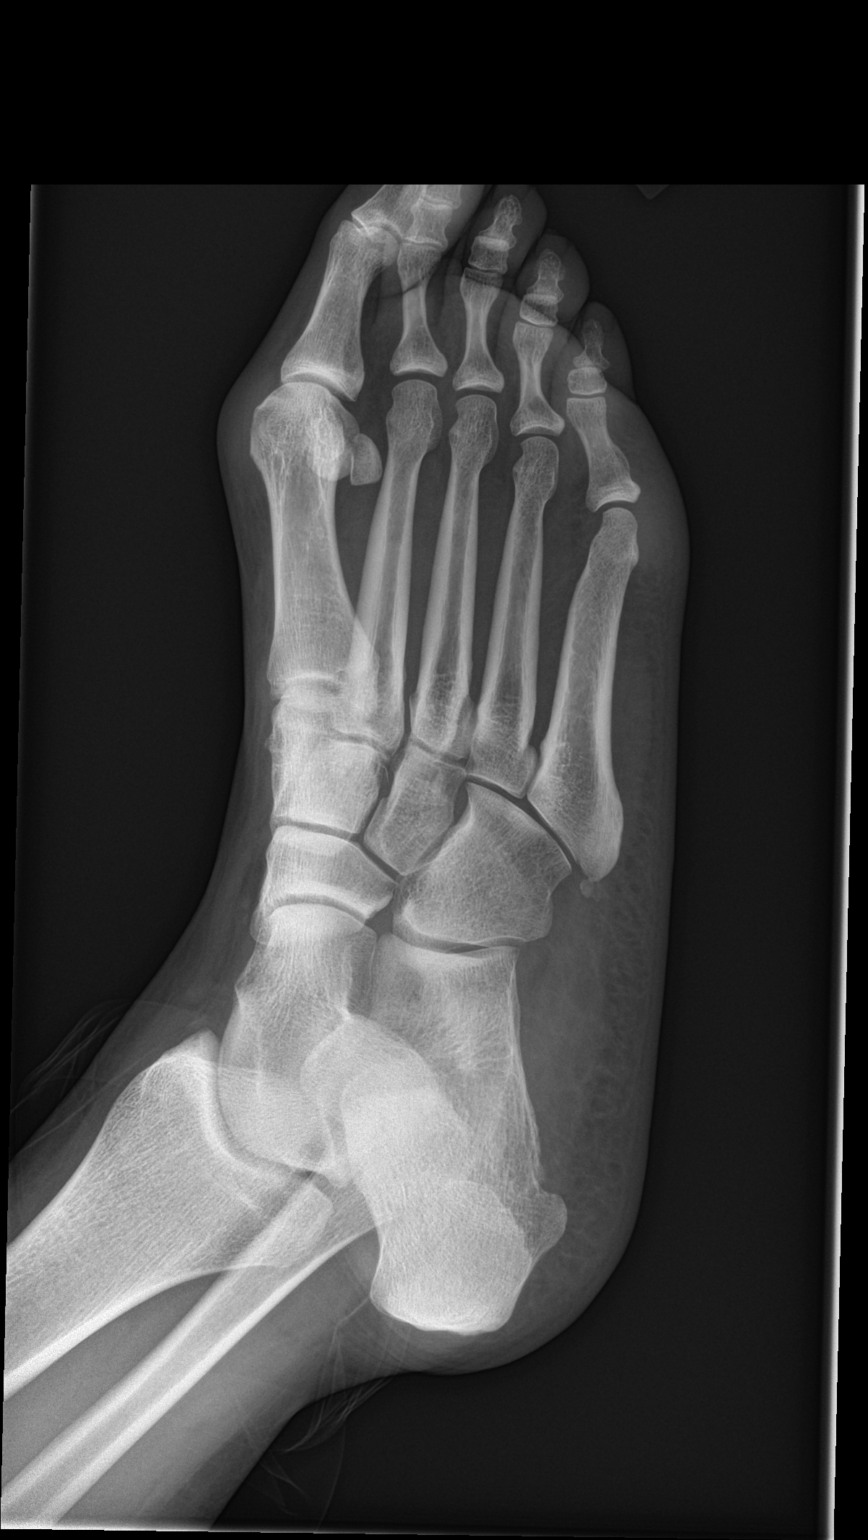

[foot lat]
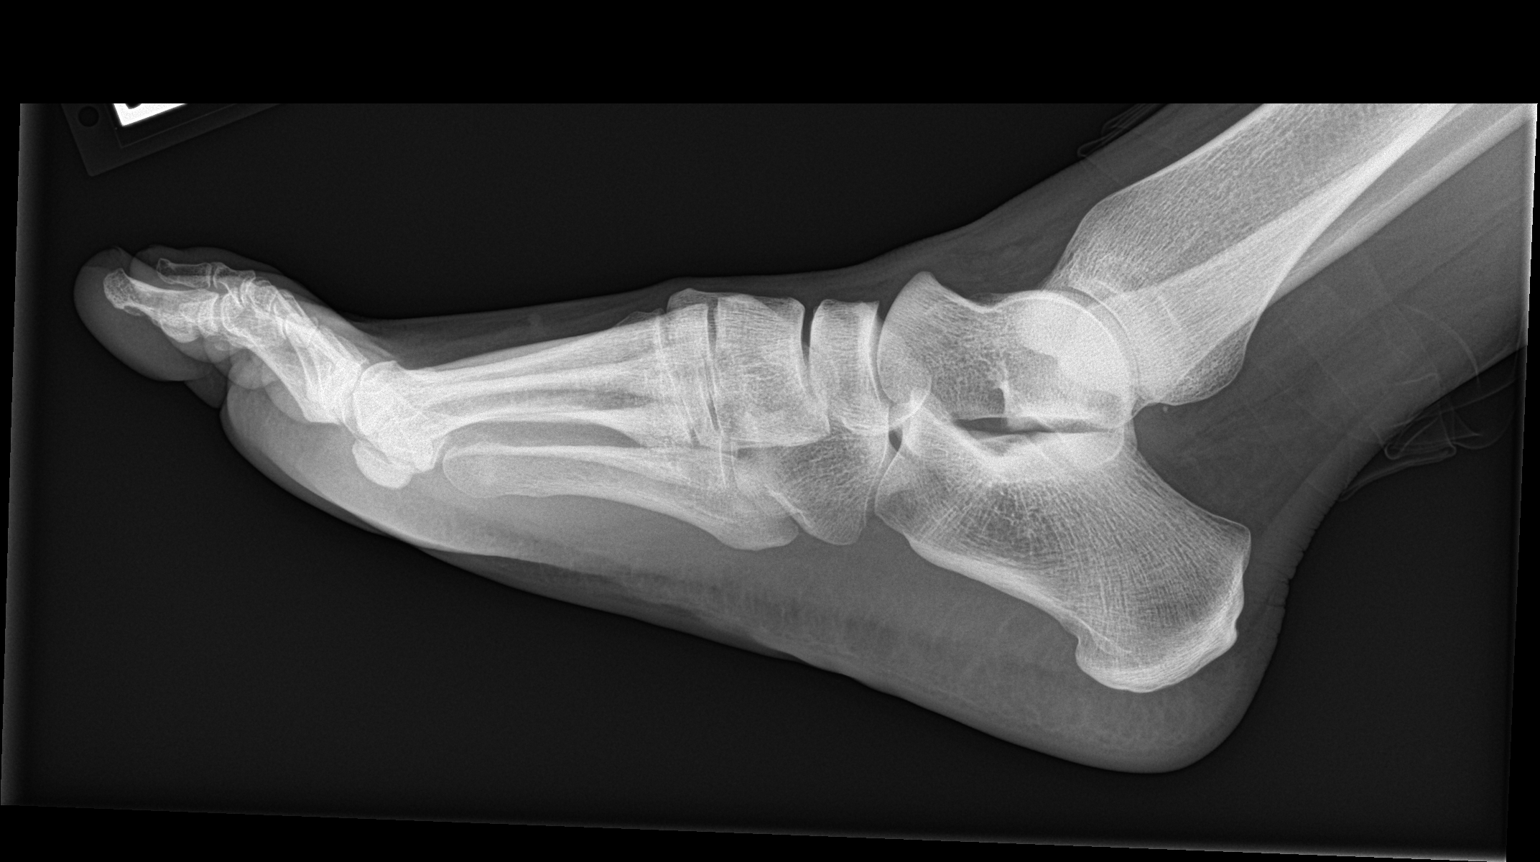

[3 of 3 positions shown; findings below may reference images not displayed]

FINDINGS: Frontal, oblique, and lateral views were obtained. No fracture or
dislocation. Joint spaces appear normal. There is hallux valgus
deformity at the first MTP joint with early bunion formation
medially. No erosive change.
IMPRESSION: Hallux valgus deformity first MTP joint with early bunion formation
medially. No appreciable joint space narrowing or erosion. No
fracture or dislocation.
